# Patient Record
Sex: Male | Born: 1935 | Race: White | Hispanic: No | Marital: Married
Health system: Southern US, Community
[De-identification: ages and names within clinical notes are randomized; demographics above are authoritative.]

---

## 2004-10-18 ENCOUNTER — Ambulatory Visit: Payer: Self-pay | Admitting: Urology

## 2004-12-23 ENCOUNTER — Ambulatory Visit: Payer: Self-pay | Admitting: Urology

## 2004-12-30 ENCOUNTER — Ambulatory Visit: Payer: Self-pay | Admitting: Urology

## 2006-06-16 ENCOUNTER — Ambulatory Visit: Payer: Self-pay | Admitting: Gastroenterology

## 2006-07-27 ENCOUNTER — Emergency Department: Payer: Self-pay | Admitting: Internal Medicine

## 2006-08-03 ENCOUNTER — Emergency Department: Payer: Self-pay | Admitting: Unknown Physician Specialty

## 2006-10-09 ENCOUNTER — Other Ambulatory Visit: Payer: Self-pay

## 2006-10-09 ENCOUNTER — Ambulatory Visit: Payer: Self-pay | Admitting: Urology

## 2006-10-28 ENCOUNTER — Ambulatory Visit: Payer: Self-pay | Admitting: Urology

## 2007-07-17 ENCOUNTER — Ambulatory Visit: Payer: Self-pay | Admitting: Oncology

## 2007-08-13 ENCOUNTER — Inpatient Hospital Stay: Payer: Self-pay | Admitting: Internal Medicine

## 2007-08-13 ENCOUNTER — Other Ambulatory Visit: Payer: Self-pay

## 2007-09-01 ENCOUNTER — Other Ambulatory Visit: Payer: Self-pay

## 2007-09-02 ENCOUNTER — Inpatient Hospital Stay: Payer: Self-pay | Admitting: General Surgery

## 2007-09-16 ENCOUNTER — Ambulatory Visit: Payer: Self-pay | Admitting: Oncology

## 2007-09-29 ENCOUNTER — Inpatient Hospital Stay: Payer: Self-pay | Admitting: Otolaryngology

## 2008-03-27 ENCOUNTER — Other Ambulatory Visit: Payer: Self-pay

## 2008-03-27 ENCOUNTER — Ambulatory Visit: Payer: Self-pay | Admitting: Ophthalmology

## 2008-04-17 ENCOUNTER — Ambulatory Visit: Payer: Self-pay | Admitting: Ophthalmology

## 2008-07-27 IMAGING — CR DG CHEST 1V PORT
1 series · 1 of 1 positions shown · non-contrast
Comparison: none

REASON FOR EXAM: abdominal pain
COMMENTS:

PROCEDURE:     DXR - DXR PORTABLE CHEST SINGLE VIEW  - September 02, 2007 [DATE]
RESULT:     Comparison is made to study 13 August, 2007.
The lungs are well expanded and clear. The heart and pulmonary vascularity
are normal in appearance. There is no pleural effusion.

[view not recorded]
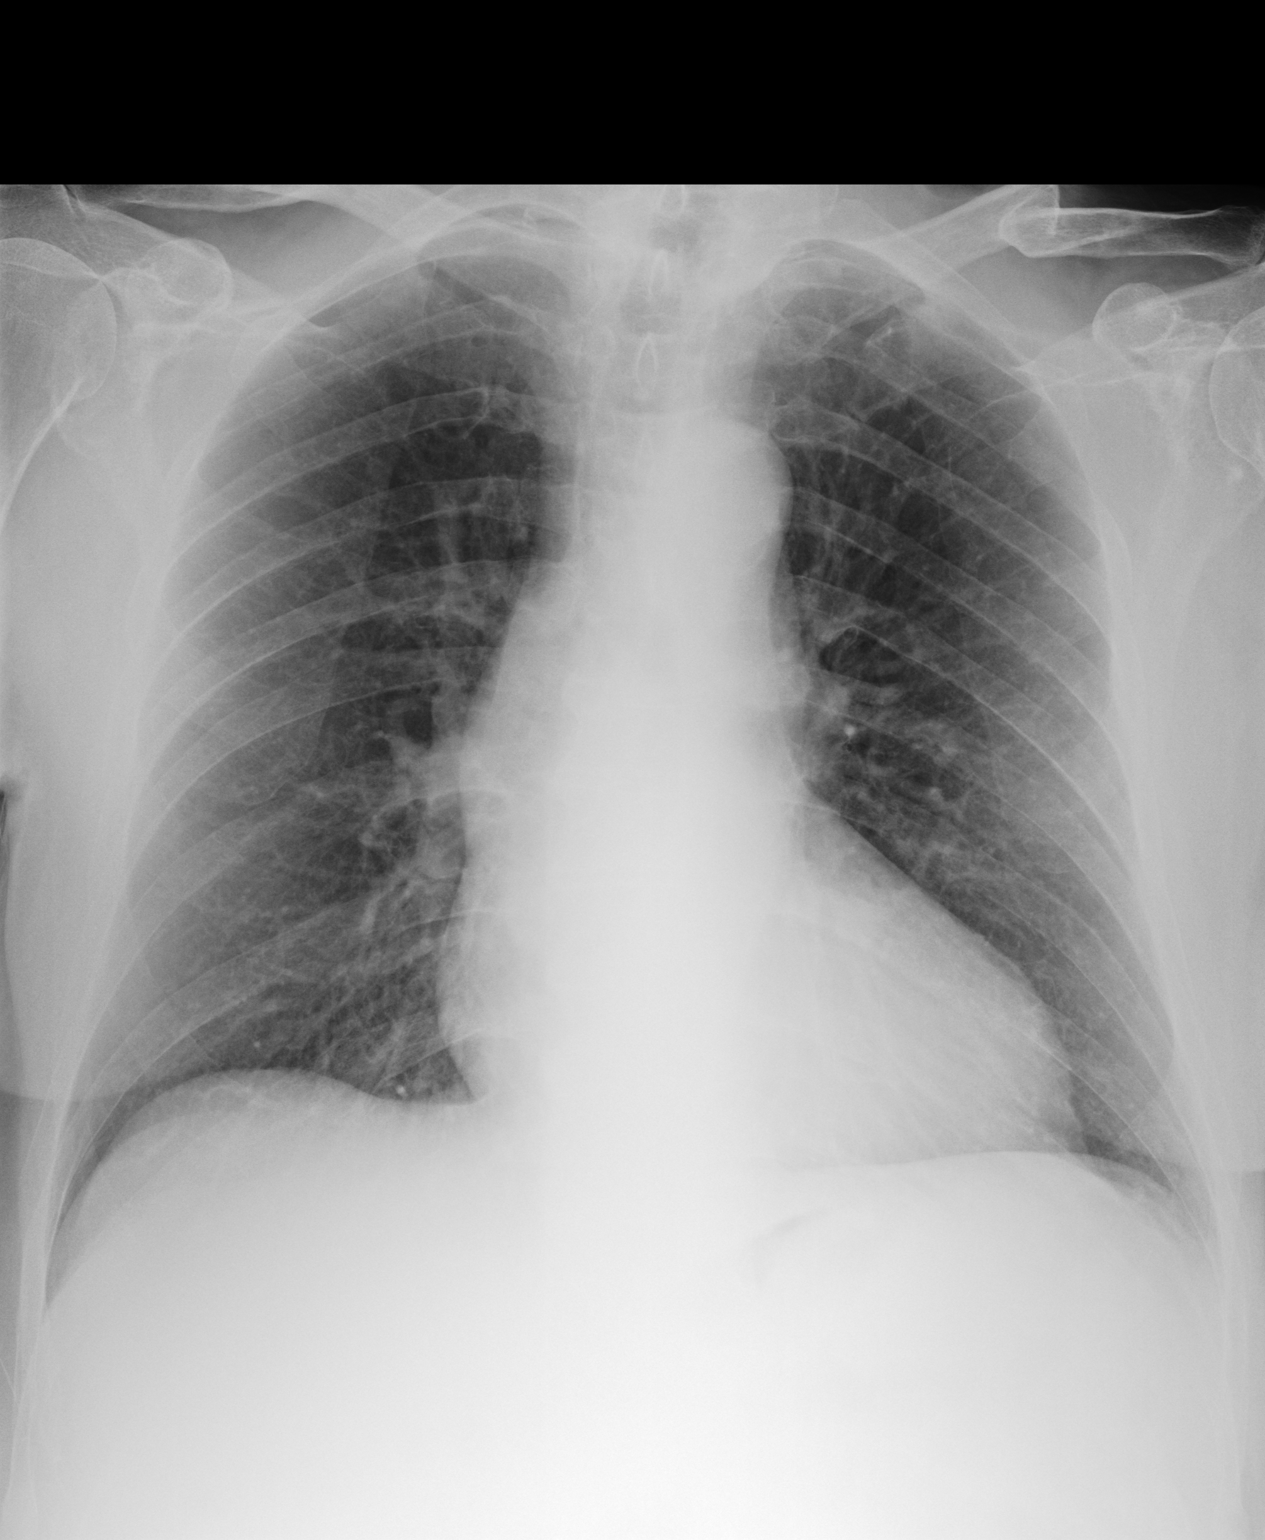

[1 of 1 positions shown; findings below may reference images not displayed]

IMPRESSION: I do not see evidence of acute cardiopulmonary abnormality.

## 2008-07-27 IMAGING — US ABDOMEN ULTRASOUND
1 series · 17 of 25 positions shown · non-contrast
Comparison: none

REASON FOR EXAM: Rule out gallstones
COMMENTS:

[Series 1: abdomen ultrasound · 17 of 80 slices shown]
[im 1/80]
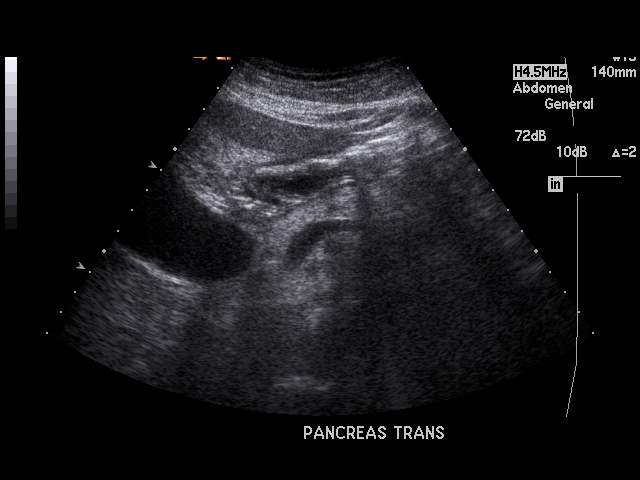
[im 7/80]
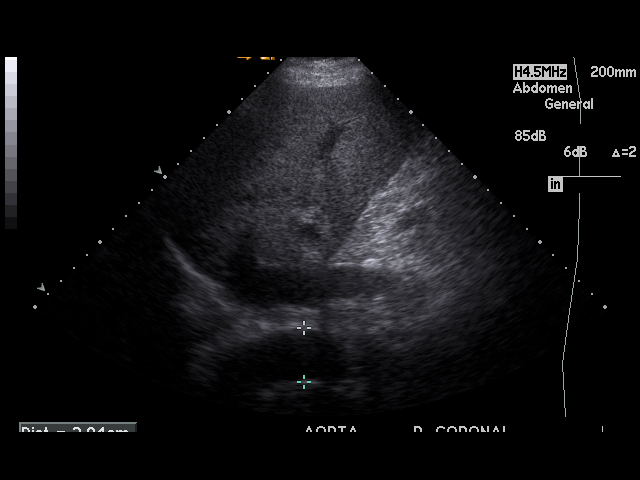
[im 10/80]
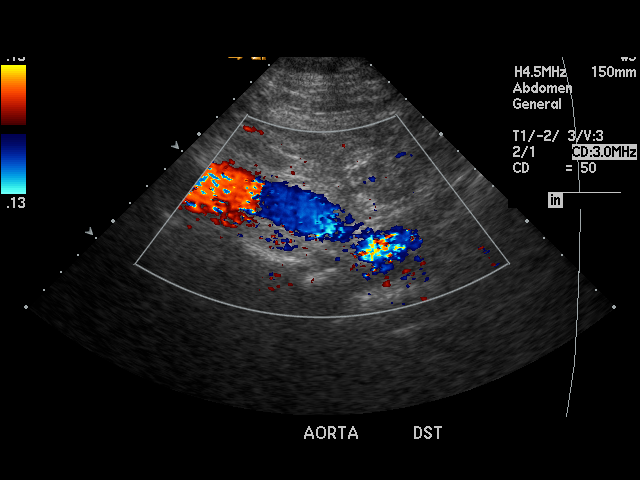
[im 17/80]
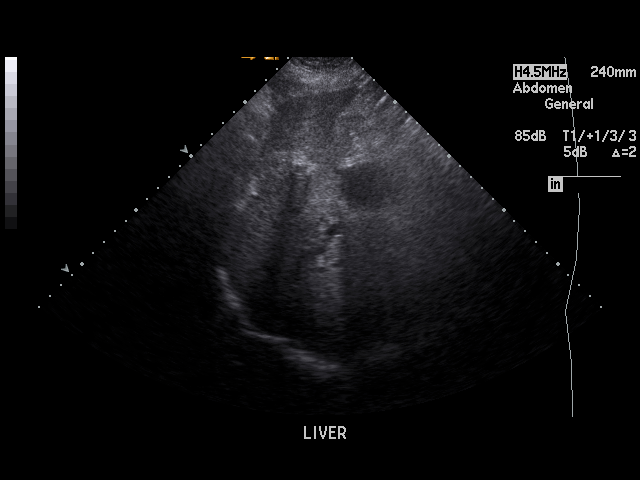
[im 20/80]
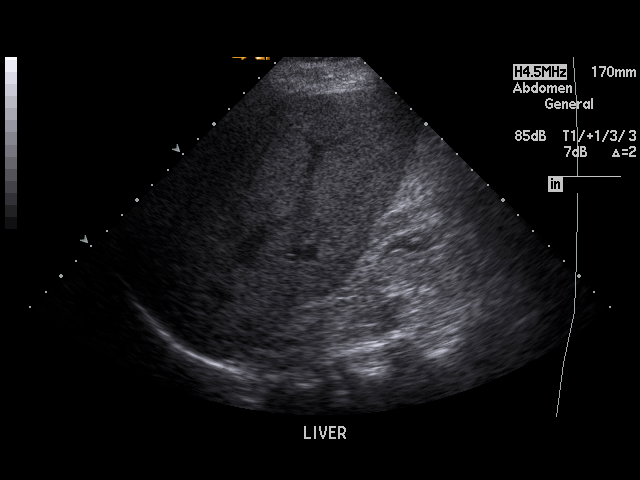
[im 27/80]
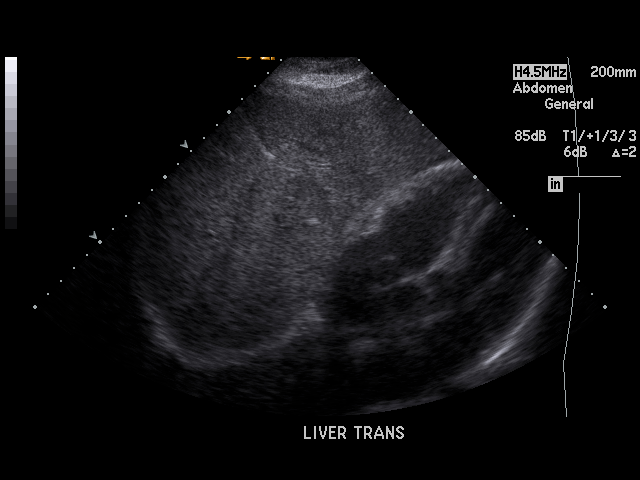
[im 30/80]
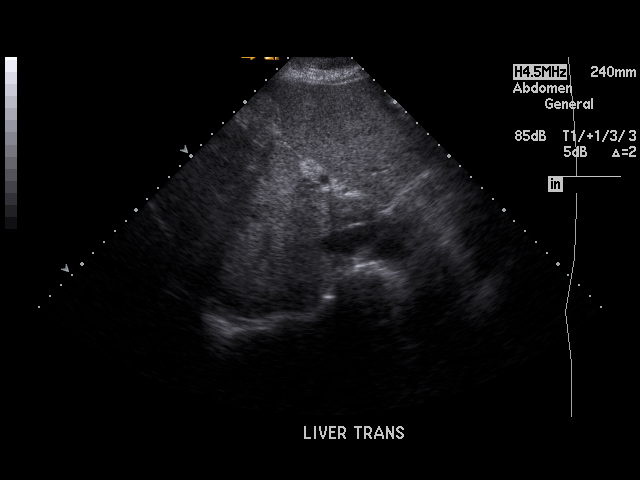
[im 37/80]
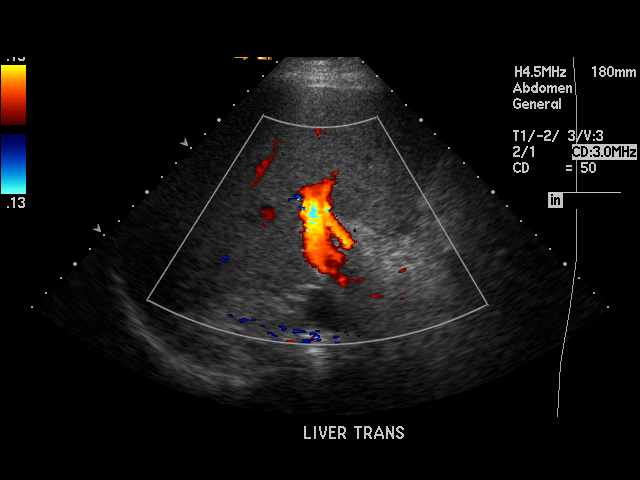
[im 40/80]
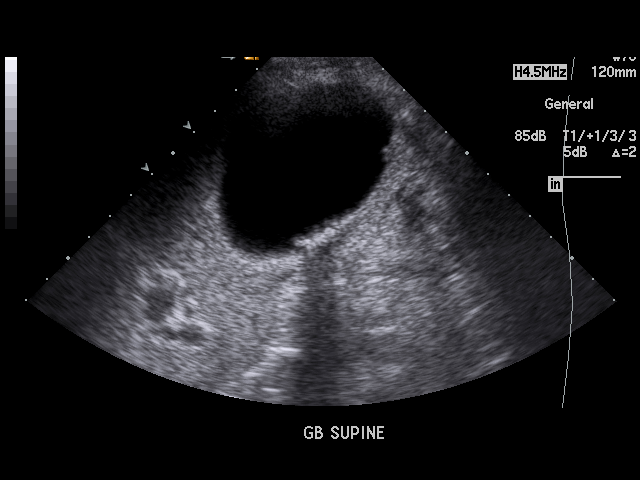
[im 43/80]
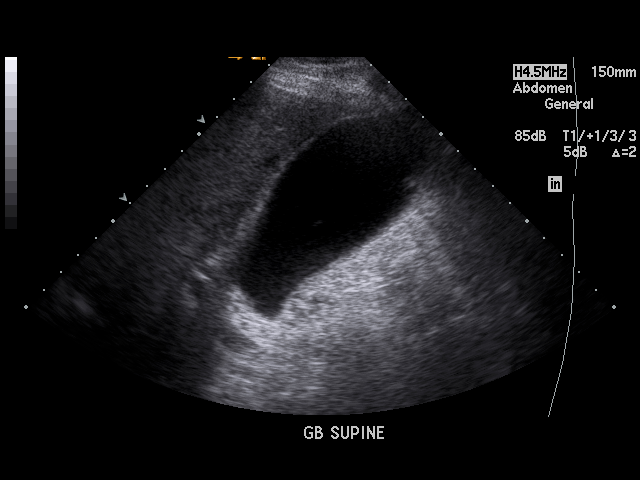
[im 50/80]
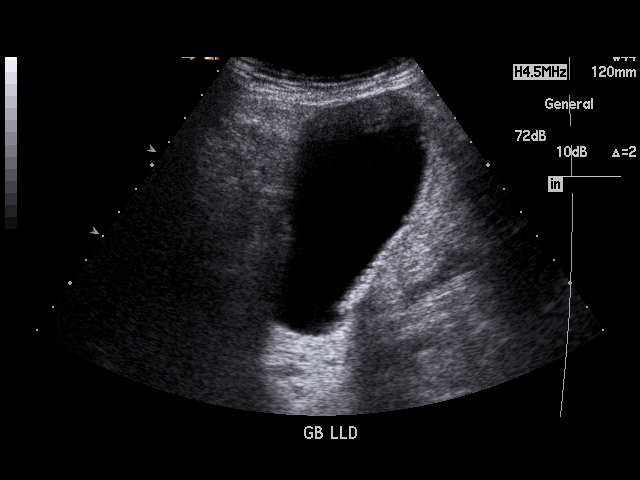
[im 53/80]
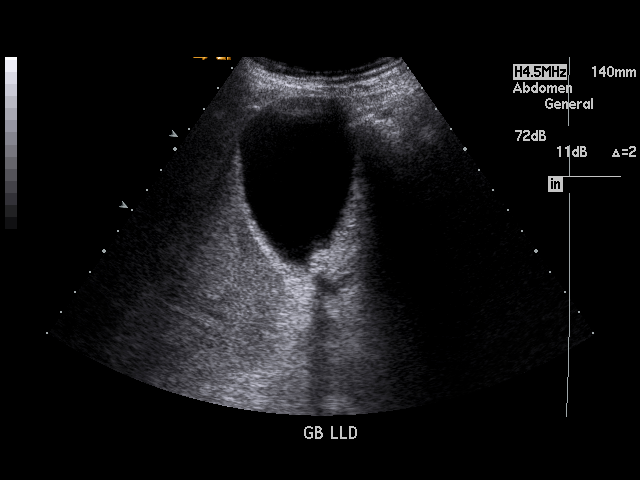
[im 60/80]
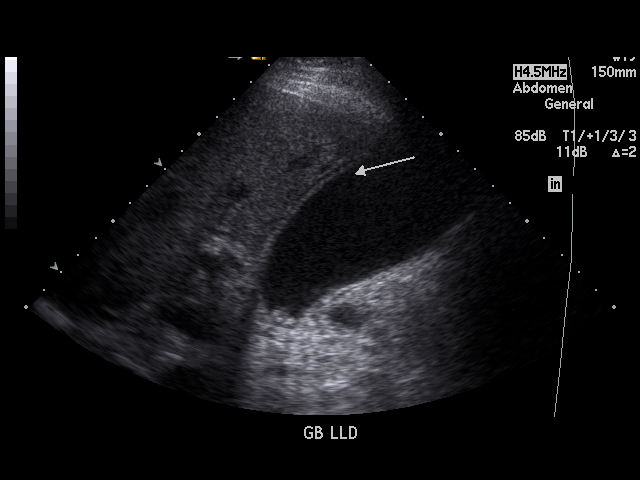
[im 63/80]
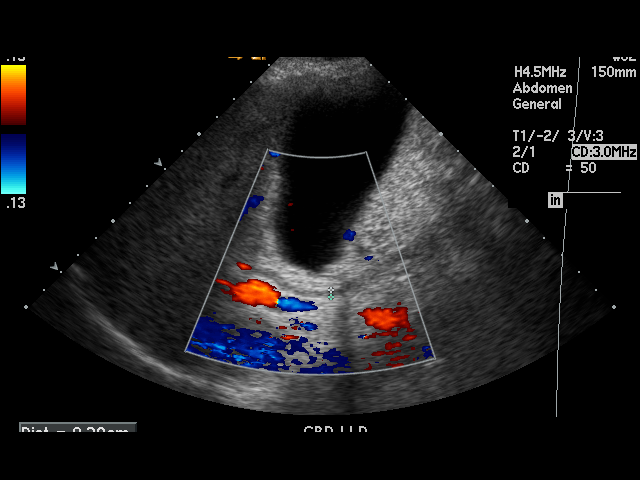
[im 70/80]
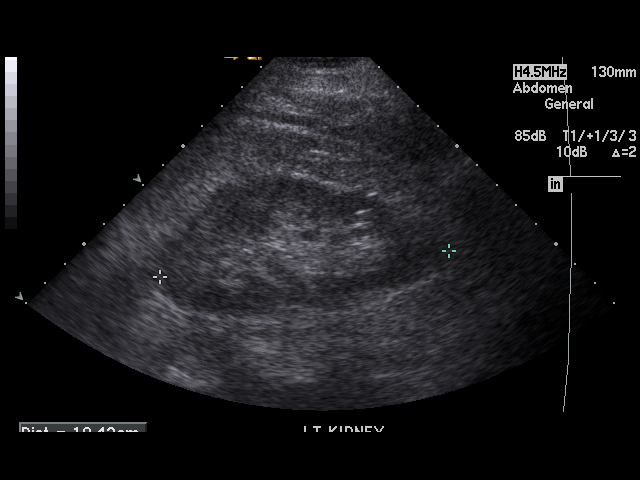
[im 73/80]
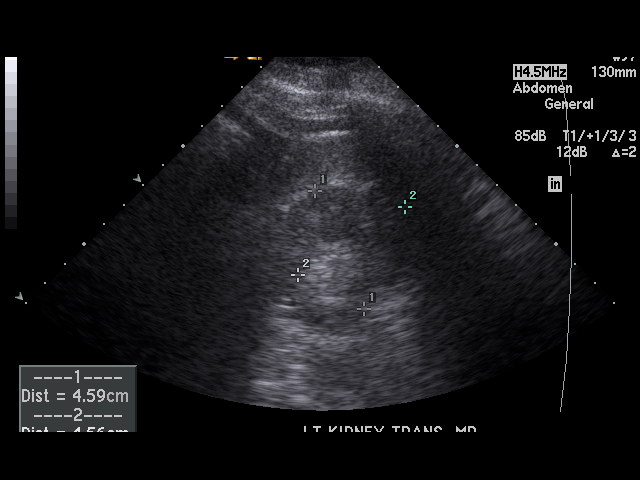
[im 80/80]
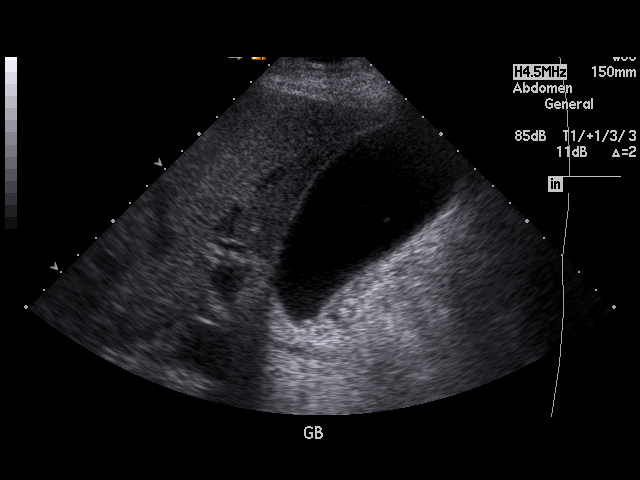

[17 of 25 positions shown; findings below may reference images not displayed]

PROCEDURE:     US  - US ABDOMEN GENERAL SURVEY  - September 02, 2007  [DATE]

RESULT:     The liver, spleen, abdominal aorta and inferior vena cava show
no significant abnormalities. The pancreas is normal in size. There are
multiple, tiny echo densities in the gallbladder compatible stones. One of
the stones or a complex of stones remains in the fundus when the patient
changes position. The possibility of impaction or impending impaction cannot
be excluded. No thickening of the gallbladder wall is currently seen. There
is an apparent trace of pericholecystic fluid. There are noted small cysts
in both kidneys with that on the LEFT measuring 1.87 cm at maximum diameter
and the cyst on the RIGHT measuring 1.76 cm at maximum diameter.
IMPRESSION: 1.  Cholelithiasis with echo densities being observed in the neck of the
gallbladder that do not move as the patient changes position. Impaction or
impending impaction cannot be excluded.
2.  There is no thickening of the gallbladder wall but there does appear to
be a trace of pericholecystic fluid suggestive of gallbladder inflammation.
3.  Bilateral renal cysts are seen.

## 2009-07-17 ENCOUNTER — Ambulatory Visit: Payer: Self-pay | Admitting: Gastroenterology

## 2010-07-29 ENCOUNTER — Encounter: Payer: Self-pay | Admitting: Internal Medicine

## 2010-08-15 ENCOUNTER — Encounter: Payer: Self-pay | Admitting: Internal Medicine

## 2010-09-15 ENCOUNTER — Encounter: Payer: Self-pay | Admitting: Internal Medicine

## 2010-11-12 ENCOUNTER — Ambulatory Visit: Payer: Self-pay | Admitting: General Practice

## 2010-11-27 ENCOUNTER — Inpatient Hospital Stay: Payer: Self-pay | Admitting: General Practice

## 2010-12-03 ENCOUNTER — Encounter: Payer: Self-pay | Admitting: Internal Medicine

## 2010-12-15 ENCOUNTER — Encounter: Payer: Self-pay | Admitting: Internal Medicine

## 2010-12-19 ENCOUNTER — Ambulatory Visit: Payer: Self-pay | Admitting: Internal Medicine

## 2011-01-14 ENCOUNTER — Encounter: Payer: Self-pay | Admitting: Internal Medicine

## 2011-03-28 ENCOUNTER — Inpatient Hospital Stay: Payer: Self-pay | Admitting: Internal Medicine

## 2011-08-02 ENCOUNTER — Emergency Department: Payer: Self-pay | Admitting: Unknown Physician Specialty

## 2012-02-14 ENCOUNTER — Ambulatory Visit: Payer: Self-pay | Admitting: Internal Medicine

## 2012-02-17 LAB — COMPREHENSIVE METABOLIC PANEL
Albumin: 4.1 g/dL (ref 3.4–5.0)
BUN: 19 mg/dL — ABNORMAL HIGH (ref 7–18)
Bilirubin,Total: 0.5 mg/dL (ref 0.2–1.0)
Calcium, Total: 9.5 mg/dL (ref 8.5–10.1)
Chloride: 106 mmol/L (ref 98–107)
Co2: 28 mmol/L (ref 21–32)
Creatinine: 1.36 mg/dL — ABNORMAL HIGH (ref 0.60–1.30)
EGFR (African American): 56 — ABNORMAL LOW
Osmolality: 288 (ref 275–301)
Potassium: 4.2 mmol/L (ref 3.5–5.1)
SGOT(AST): 44 U/L — ABNORMAL HIGH (ref 15–37)
SGPT (ALT): 43 U/L
Sodium: 142 mmol/L (ref 136–145)
Total Protein: 7.9 g/dL (ref 6.4–8.2)

## 2012-02-18 ENCOUNTER — Inpatient Hospital Stay: Payer: Self-pay | Admitting: Internal Medicine

## 2012-02-18 LAB — CBC WITH DIFFERENTIAL/PLATELET
Basophil %: 0.2 %
Eosinophil #: 0.1 10*3/uL (ref 0.0–0.7)
HGB: 13.2 g/dL (ref 13.0–18.0)
Lymphocyte %: 5.6 %
MCH: 31.2 pg (ref 26.0–34.0)
MCHC: 32.3 g/dL (ref 32.0–36.0)
MCV: 97 fL (ref 80–100)
Platelet: 276 10*3/uL (ref 150–440)
RBC: 4.23 10*6/uL — ABNORMAL LOW (ref 4.40–5.90)
RDW: 13.8 % (ref 11.5–14.5)

## 2012-02-18 LAB — URINALYSIS, COMPLETE
Bilirubin,UR: NEGATIVE
Blood: NEGATIVE
Glucose,UR: NEGATIVE mg/dL (ref 0–75)
Nitrite: NEGATIVE
Ph: 5 (ref 4.5–8.0)
Protein: 100
RBC,UR: 1 /HPF (ref 0–5)
Specific Gravity: 1.03 (ref 1.003–1.030)
Squamous Epithelial: 1

## 2012-02-19 LAB — CBC WITH DIFFERENTIAL/PLATELET
Basophil #: 0.1 10*3/uL (ref 0.0–0.1)
Basophil %: 0.8 %
Eosinophil #: 0.7 10*3/uL (ref 0.0–0.7)
Eosinophil %: 6.4 %
HCT: 33.7 % — ABNORMAL LOW (ref 40.0–52.0)
HGB: 11.1 g/dL — ABNORMAL LOW (ref 13.0–18.0)
Lymphocyte #: 1.2 10*3/uL (ref 1.0–3.6)
MCH: 31.9 pg (ref 26.0–34.0)
MCHC: 33 g/dL (ref 32.0–36.0)
MCV: 97 fL (ref 80–100)
Platelet: 180 10*3/uL (ref 150–440)
WBC: 10.2 10*3/uL (ref 3.8–10.6)

## 2012-02-19 LAB — BASIC METABOLIC PANEL
Anion Gap: 9 (ref 7–16)
Calcium, Total: 8.7 mg/dL (ref 8.5–10.1)
Chloride: 107 mmol/L (ref 98–107)
Co2: 26 mmol/L (ref 21–32)
Creatinine: 1.38 mg/dL — ABNORMAL HIGH (ref 0.60–1.30)
EGFR (Non-African Amer.): 48 — ABNORMAL LOW
Potassium: 3.9 mmol/L (ref 3.5–5.1)
Sodium: 142 mmol/L (ref 136–145)

## 2012-02-20 LAB — BASIC METABOLIC PANEL
BUN: 14 mg/dL (ref 7–18)
Calcium, Total: 8.3 mg/dL — ABNORMAL LOW (ref 8.5–10.1)
Co2: 26 mmol/L (ref 21–32)
Creatinine: 1.2 mg/dL (ref 0.60–1.30)
Glucose: 97 mg/dL (ref 65–99)
Potassium: 3.6 mmol/L (ref 3.5–5.1)

## 2012-02-20 LAB — CBC WITH DIFFERENTIAL/PLATELET
Basophil #: 0.1 10*3/uL (ref 0.0–0.1)
Basophil %: 0.6 %
HGB: 10.8 g/dL — ABNORMAL LOW (ref 13.0–18.0)
Lymphocyte #: 1.1 10*3/uL (ref 1.0–3.6)
MCH: 32.9 pg (ref 26.0–34.0)
MCV: 96 fL (ref 80–100)
Monocyte %: 6.5 %
Neutrophil #: 7.9 10*3/uL — ABNORMAL HIGH (ref 1.4–6.5)
Neutrophil %: 74 %
Platelet: 159 10*3/uL (ref 150–440)
RDW: 13 % (ref 11.5–14.5)
WBC: 10.6 10*3/uL (ref 3.8–10.6)

## 2012-02-21 LAB — HEMOGLOBIN: HGB: 10.9 g/dL — ABNORMAL LOW (ref 13.0–18.0)

## 2012-02-22 LAB — BASIC METABOLIC PANEL
BUN: 17 mg/dL (ref 7–18)
Calcium, Total: 8.6 mg/dL (ref 8.5–10.1)
EGFR (African American): >60
EGFR (Non-African Amer.): >60
Osmolality: 285 (ref 275–301)

## 2012-02-23 LAB — BASIC METABOLIC PANEL
Anion Gap: 8 (ref 7–16)
BUN: 13 mg/dL (ref 7–18)
Calcium, Total: 8.9 mg/dL (ref 8.5–10.1)
Co2: 28 mmol/L (ref 21–32)
Creatinine: 0.84 mg/dL (ref 0.60–1.30)
EGFR (African American): >60
EGFR (Non-African Amer.): >60
Osmolality: 283 (ref 275–301)
Potassium: 3.8 mmol/L (ref 3.5–5.1)
Sodium: 142 mmol/L (ref 136–145)

## 2012-02-23 LAB — HEMOGLOBIN: HGB: 10.6 g/dL — ABNORMAL LOW (ref 13.0–18.0)

## 2012-03-15 ENCOUNTER — Ambulatory Visit: Payer: Self-pay | Admitting: Internal Medicine

## 2012-10-04 ENCOUNTER — Emergency Department: Payer: Self-pay | Admitting: Emergency Medicine

## 2012-12-14 ENCOUNTER — Emergency Department: Payer: Self-pay | Admitting: Emergency Medicine

## 2012-12-14 LAB — URINALYSIS, COMPLETE
Bacteria: NONE SEEN
Bilirubin,UR: NEGATIVE
Blood: NEGATIVE
Glucose,UR: NEGATIVE mg/dL (ref 0–75)
Hyaline Cast: 4
Leukocyte Esterase: NEGATIVE
Nitrite: NEGATIVE
Ph: 6 (ref 4.5–8.0)
RBC,UR: <1 /HPF (ref 0–5)
Squamous Epithelial: <1

## 2012-12-14 LAB — COMPREHENSIVE METABOLIC PANEL
Alkaline Phosphatase: 67 U/L (ref 50–136)
Anion Gap: 4 — ABNORMAL LOW (ref 7–16)
BUN: 24 mg/dL — ABNORMAL HIGH (ref 7–18)
Chloride: 104 mmol/L (ref 98–107)
Co2: 33 mmol/L — ABNORMAL HIGH (ref 21–32)
Creatinine: 1.5 mg/dL — ABNORMAL HIGH (ref 0.60–1.30)
EGFR (African American): 50 — ABNORMAL LOW
EGFR (Non-African Amer.): 43 — ABNORMAL LOW
Glucose: 95 mg/dL (ref 65–99)
Osmolality: 285 (ref 275–301)
Potassium: 3.8 mmol/L (ref 3.5–5.1)
SGOT(AST): 17 U/L (ref 15–37)
SGPT (ALT): 10 U/L — ABNORMAL LOW (ref 12–78)
Total Protein: 6.5 g/dL (ref 6.4–8.2)

## 2012-12-14 LAB — CBC
HCT: 38.5 % — ABNORMAL LOW (ref 40.0–52.0)
MCH: 31 pg (ref 26.0–34.0)
MCV: 94 fL (ref 80–100)
Platelet: 366 10*3/uL (ref 150–440)
RDW: 13.8 % (ref 11.5–14.5)
WBC: 10.8 10*3/uL — ABNORMAL HIGH (ref 3.8–10.6)

## 2013-01-19 ENCOUNTER — Emergency Department: Payer: Self-pay | Admitting: Emergency Medicine

## 2013-01-21 ENCOUNTER — Emergency Department: Payer: Self-pay | Admitting: Emergency Medicine

## 2013-01-21 LAB — COMPREHENSIVE METABOLIC PANEL
Alkaline Phosphatase: 70 U/L (ref 50–136)
Bilirubin,Total: 0.6 mg/dL (ref 0.2–1.0)
Chloride: 106 mmol/L (ref 98–107)
Co2: 32 mmol/L (ref 21–32)
EGFR (African American): 58 — ABNORMAL LOW
EGFR (Non-African Amer.): 50 — ABNORMAL LOW
Osmolality: 280 (ref 275–301)
Potassium: 4 mmol/L (ref 3.5–5.1)
SGPT (ALT): 15 U/L (ref 12–78)
Sodium: 140 mmol/L (ref 136–145)

## 2013-01-21 LAB — CK TOTAL AND CKMB (NOT AT ARMC)
CK, Total: 37 U/L (ref 35–232)
CK-MB: <0.5 ng/mL — ABNORMAL LOW (ref 0.5–3.6)

## 2013-01-21 LAB — CBC
HCT: 39.6 % — ABNORMAL LOW (ref 40.0–52.0)
MCHC: 33.7 g/dL (ref 32.0–36.0)
Platelet: 255 10*3/uL (ref 150–440)

## 2013-01-21 LAB — URINALYSIS, COMPLETE
Blood: NEGATIVE
Glucose,UR: NEGATIVE mg/dL (ref 0–75)
Leukocyte Esterase: NEGATIVE
Protein: 30
Specific Gravity: 1.023 (ref 1.003–1.030)
Squamous Epithelial: 1
WBC UR: 2 /HPF (ref 0–5)

## 2013-01-21 LAB — TROPONIN I: Troponin-I: <0.02 ng/mL

## 2013-07-04 ENCOUNTER — Emergency Department: Payer: Self-pay | Admitting: Emergency Medicine

## 2013-07-04 LAB — URINALYSIS, COMPLETE
Bilirubin,UR: NEGATIVE
Glucose,UR: NEGATIVE mg/dL (ref 0–75)
Nitrite: NEGATIVE
Protein: 30
RBC,UR: 26 /HPF (ref 0–5)
Specific Gravity: 1.016 (ref 1.003–1.030)
Squamous Epithelial: 1

## 2013-07-04 LAB — BASIC METABOLIC PANEL
Anion Gap: 4 — ABNORMAL LOW (ref 7–16)
Calcium, Total: 9 mg/dL (ref 8.5–10.1)
Chloride: 108 mmol/L — ABNORMAL HIGH (ref 98–107)
Creatinine: 1.39 mg/dL — ABNORMAL HIGH (ref 0.60–1.30)
EGFR (African American): 54 — ABNORMAL LOW
Glucose: 109 mg/dL — ABNORMAL HIGH (ref 65–99)
Osmolality: 283 (ref 275–301)
Potassium: 3.7 mmol/L (ref 3.5–5.1)
Sodium: 141 mmol/L (ref 136–145)

## 2013-07-04 LAB — CBC
HCT: 39.8 % — ABNORMAL LOW (ref 40.0–52.0)
MCH: 32.5 pg (ref 26.0–34.0)
RDW: 13.4 % (ref 11.5–14.5)

## 2013-07-06 LAB — URINE CULTURE

## 2013-09-15 DEATH — deceased

## 2014-05-29 IMAGING — CT CT HEAD WITHOUT CONTRAST
1 series · 15 of 30 positions shown, 19 images · non-contrast
Comparison: none

REASON FOR EXAM: syncope
COMMENTS:

[Series 2: soft tissue · axial · 0.42mm/px · z∈[+305,+445]mm · 15 of 32 slices shown, 19 images]
[im 2/32  brain]
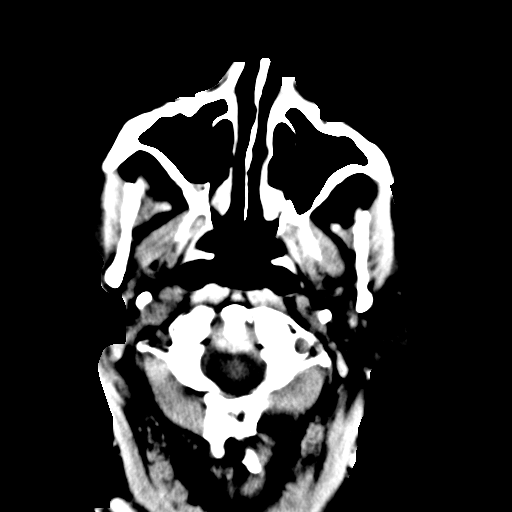
[im 2/32  bone]
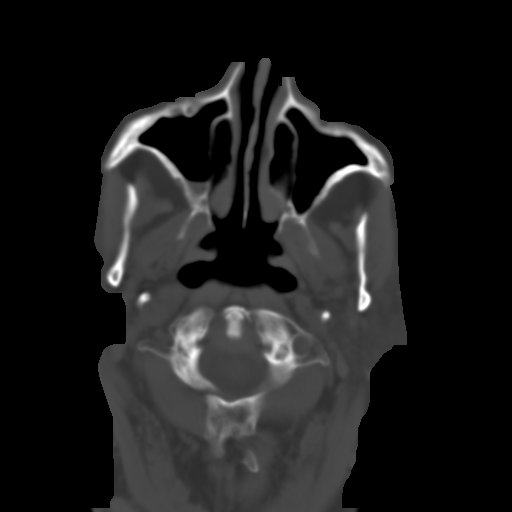
[im 4/32  brain]
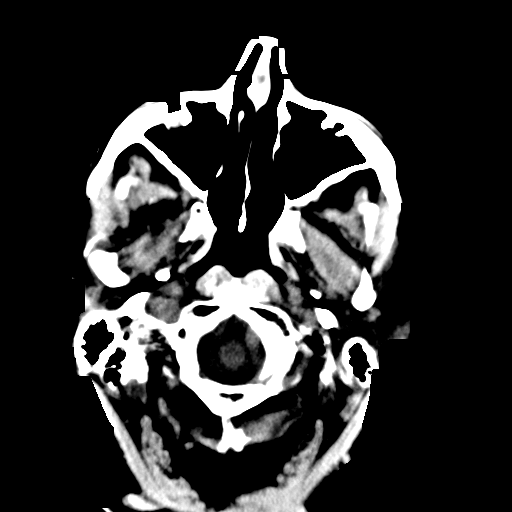
[im 6/32  brain]
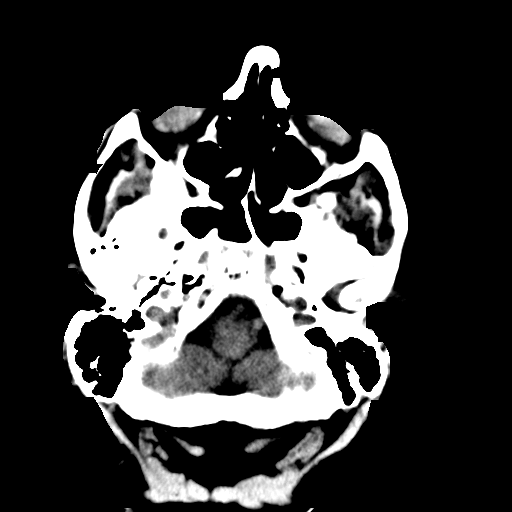
[im 8/32  brain]
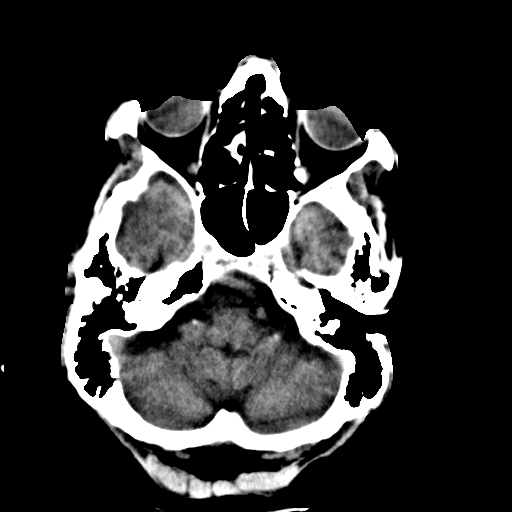
[im 10/32  brain]
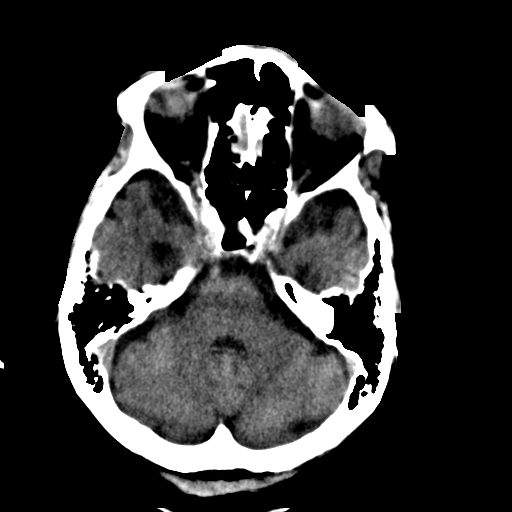
[im 10/32  bone]
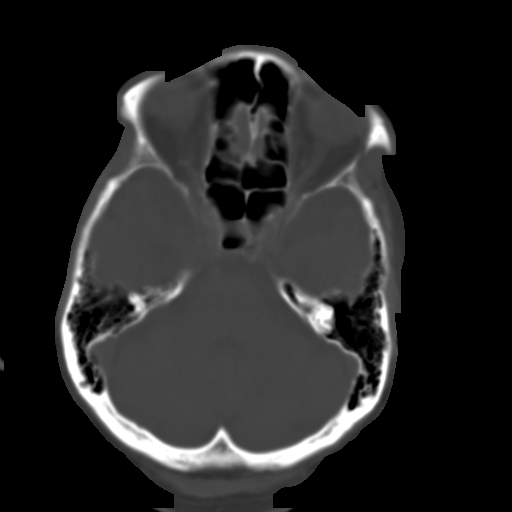
[im 12/32  brain]
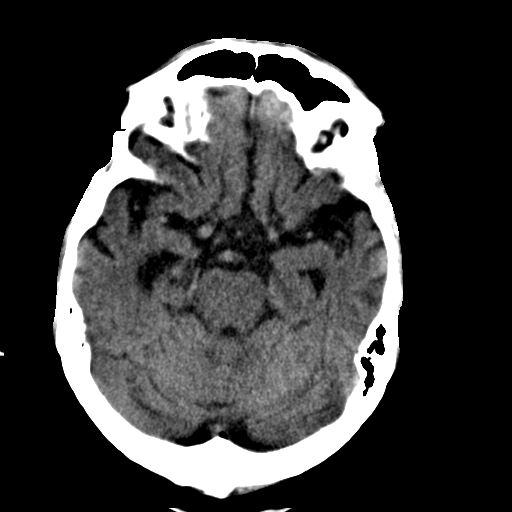
[im 14/32  brain]
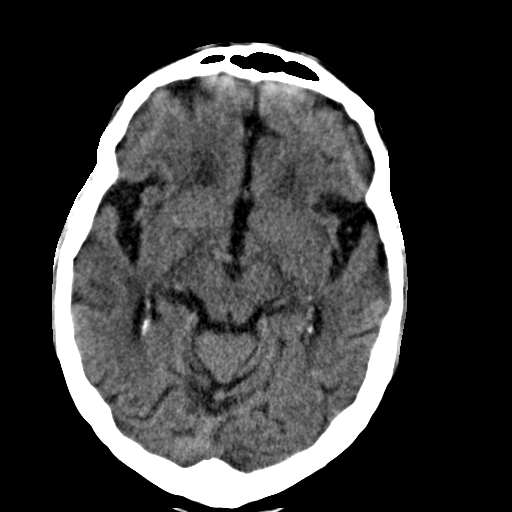
[im 17/32  brain]
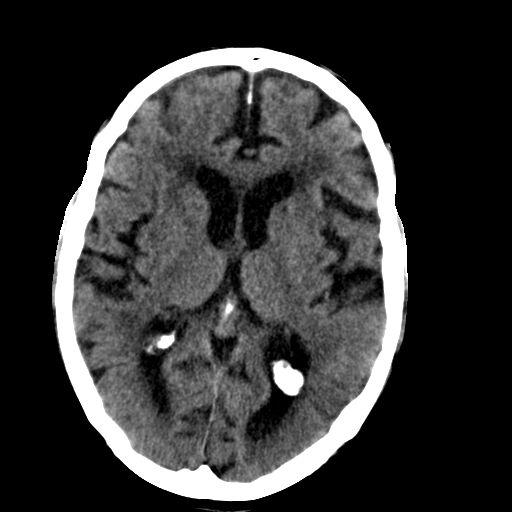
[im 18/32  brain]
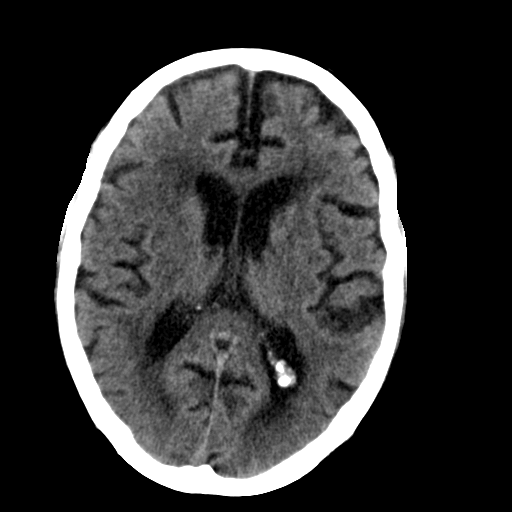
[im 18/32  bone]
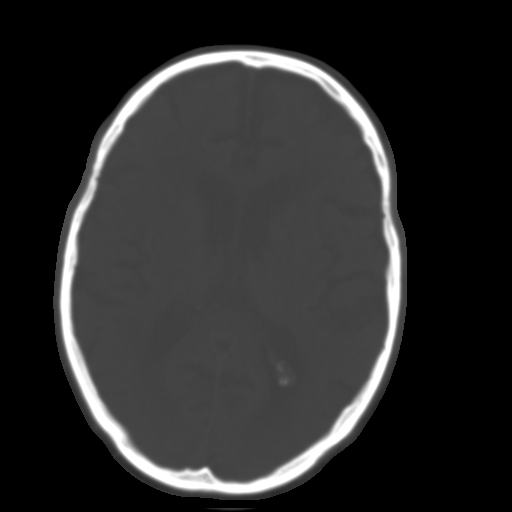
[im 20/32  brain]
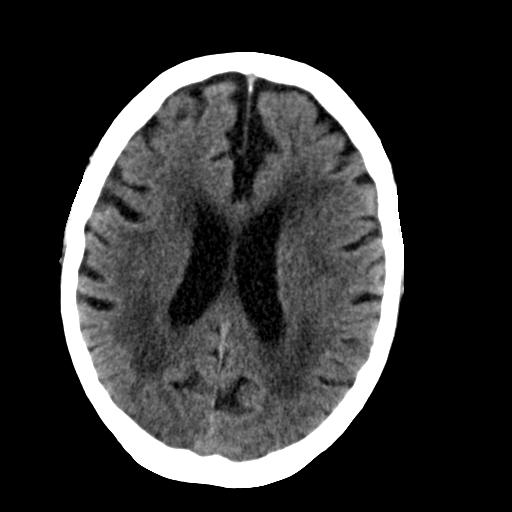
[im 22/32  brain]
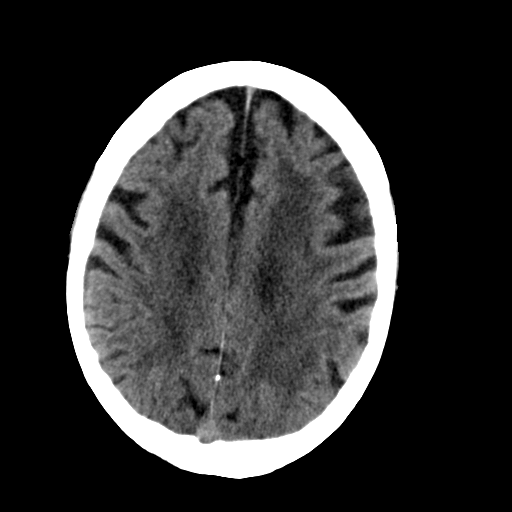
[im 24/32  brain]
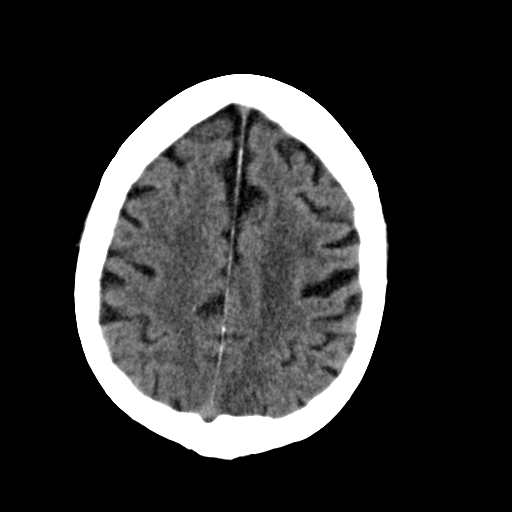
[im 26/32  brain]
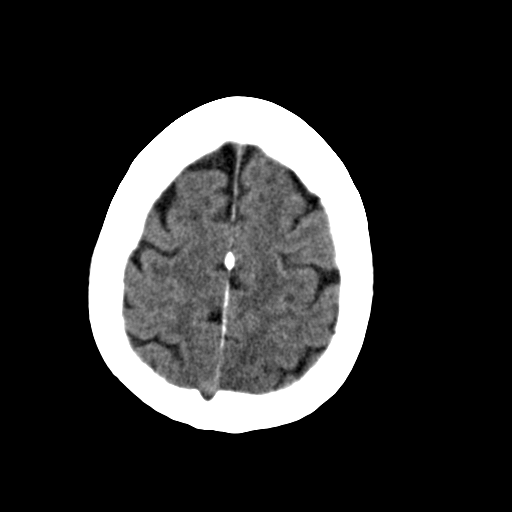
[im 26/32  bone]
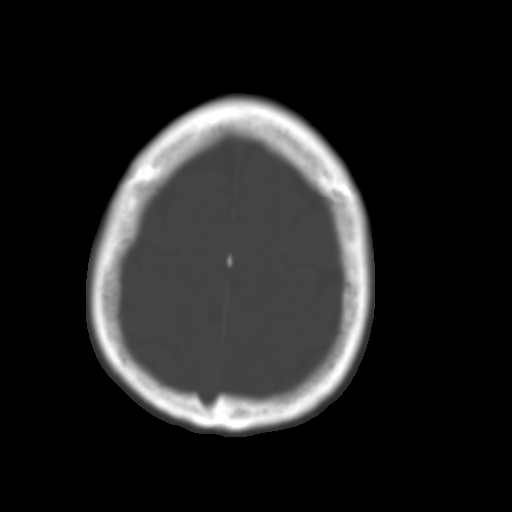
[im 28/32  brain]
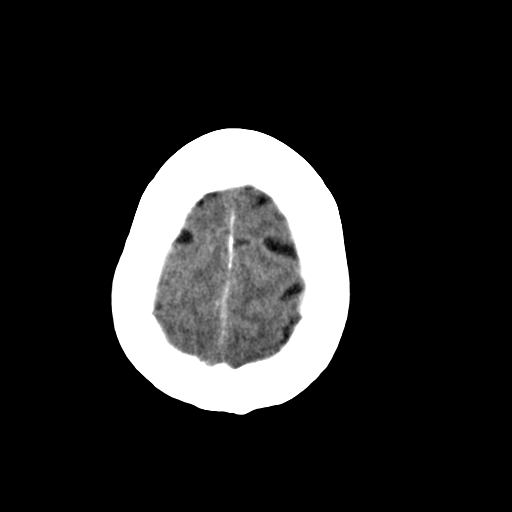
[im 30/32  brain]
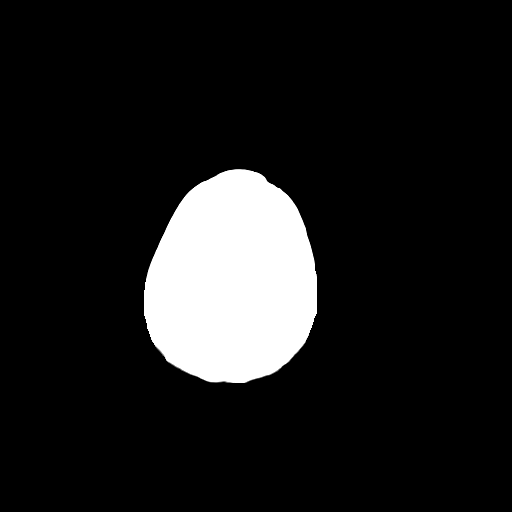

[15 of 30 positions shown; findings below may reference images not displayed]

PROCEDURE:     CT  - CT HEAD WITHOUT CONTRAST  - July 04, 2013  [DATE]

RESULT:     Axial noncontrast CT scanning was performed through the brain
with reconstructions at 5 mm intervals and slice thicknesses. Comparison is
made to study January 21, 2013.

There is moderate diffuse cerebral and cerebellar atrophy consistent with
the patient's age. There is mild compensatory ventriculomegaly. There is
decreased density in the deep white matter both cerebral hemispheres
consistent with chronic small vessel ischemia. There is no evidence of an
acute intracranial hemorrhage nor of an evolving ischemic infarction.

There is a small air-fluid level in the posterior aspect of the right
maxillary sinus similar to that seen in January 2013. There is no evidence of an
acute skull fracture.
IMPRESSION: 1. There is no evidence of an acute intracranial hemorrhage nor of an
evolving ischemic infarction.
2. There is no intracranial mass effect nor hydrocephalus.
3. There is decreased density in the deep white matter of both cerebral
hemispheres consistent with chronic small vessel ischemic type change.

[REDACTED]

## 2015-01-07 NOTE — Consult Note (Signed)
Comments   Dr Phifer and I met with pt's daughter, Joycelyn Schmid, who is also pt's HCPOA. Daughter updated on pt's status. Pt has been living at home with gradual decline since his wife died in 05-22-23. Pt has 24/7 caregivers in the home including a sitter that stays with patient during the day, a granddaughter at night, and daughter during the weekends. Family would prefer if patient were able to return home at discharge but would be willing to accept STR as long as pt eventually returned home. Family understand that patient may not do well and want to give it the weekend before making further decisions regarding care. If it appears patient is approaching end of life, daughter would want to take patient home with hospice. verbalized preference for DNR. Will change code status to reflect family request.  30 minutes   Electronic Signatures for Addendum Section:  Phifer, Izora Gala (MD) (Signed Addendum 07-Jun-13 16:04)  Billey Chang, NP, and I met with pt's daughter. Agree with assessment and plan as outlined in above note.   Electronic Signatures: Borders, Kirt Boys (NP)  (Signed 07-Jun-13 15:56)  Authored: Palliative Care   Last Updated: 07-Jun-13 16:04 by Phifer, Izora Gala (MD)

## 2015-01-07 NOTE — H&P (Signed)
PATIENT NAME:  Mitchell Reynolds, BARTELL MR#:  [id number] DATE OF BIRTH:  1928/06/19  DATE OF ADMISSION:  02/18/2012  PRIMARY CARE PHYSICIAN: Conchita Parison Chaplin, MD   CHIEF COMPLAINT: Bilateral hip pain.   HISTORY OF PRESENT ILLNESS: Mitchell Reynolds is an 81 year old pleasant Caucasian male who was in his usual state of health at home and while walking to the bathroom he fell sustaining pain in the pelvic area and bilateral hips. The patient was unable to stand up. His caregiver called his daughter and then they called the ambulance and the patient was transported here. Evaluation here revealed a pubic ramus fracture. The patient was also seen by orthopedic surgery and their conclusion is that there is not a surgical approach. The patient is now being admitted for further evaluation and to involve physical therapy and for pain control. The patient himself does not recall why he fell, how it happened, if slipped or he tripped. He said that his memory is not good and he is forgetful.   REVIEW OF SYSTEMS: CONSTITUTIONAL: Denies any fever. No chills. No fatigue. EYES: No blurring of vision. No double vision. ENT: No hearing impairment. No sore throat. No dysphagia. CARDIOVASCULAR: No chest pain. No shortness of breath. No cough. No syncope. RESPIRATORY: No cough. No chest pain. No hemoptysis. No shortness of breath. GASTROINTESTINAL: No abdominal pain. No vomiting. No diarrhea. GENITOURINARY: No dysuria. No frequency of urination. MUSCULOSKELETAL: He reports chronic bilateral hip pain. This is ongoing for a while and he is seeing his orthopedic surgeon for that. No muscular pain. No muscular tenderness or swelling. No joint swelling. INTEGUMENTARY: No skin rash. No ulcers. NEUROLOGY: No focal weakness. No seizure activity. No headache. PSYCHIATRY: No anxiety. No depression. ENDOCRINE: No polyuria or polydipsia. No heat or cold intolerance.   PAST MEDICAL HISTORY:  1. Mild dementia; he is slightly forgetful.  2. In  July of last year he was admitted with upper gastrointestinal bleeding secondary to gastric ulcers.  3. Degenerative arthritis. 4. Systemic hypertension. 5. Prior history of bladder cancer, in remission. 6. Diverticulosis. 7. Glaucoma.   PAST SURGICAL HISTORY:  1. Bilateral total hip replacement. 2. Hernia repair.  3. Cholecystectomy.   SOCIAL HISTORY: He is widowed and lives at home. He is cared for by a caregiver. 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   SOCIAL HABITS: Nonsmoker, but occasionally he may smoke a cigar. No history of alcohol or drug abuse.   FAMILY HISTORY: The patient does not recall much details.   ADMISSION MEDICATIONS:  1. Protonix 40 mg a day. 2. Dulcolax 1 capsule twice a day.  3. Multivitamin once a day.  4. Probiotics once a day. 5. Cranberry tablets.   ALLERGIES: No known drug allergies.   PHYSICAL EXAMINATION:   VITAL SIGNS: Blood pressure 109/67, respiratory rate 16, pulse 90, temperature 95.9, and oxygen saturation 88%.   GENERAL APPEARANCE: Elderly male lying in bed in no acute distress.   HEAD AND NECK: No pallor. No icterus. No cyanosis.   EARS, NOSE, AND THROAT: Hearing was normal. Nasal mucosa, lips, and tongue were normal.   EYES: Normal iris and conjunctivae. Pupils are about 6 mm, equal and sluggishly reactive to light.   NECK: Supple. Trachea at midline. No thyromegaly. No cervical lymphadenopathy. No masses.   HEART: Normal S1 and S2. No S3 or S4. No murmur. No gallop. No carotid bruits.   RESPIRATORY: Normal breathing pattern without accessory muscles. No rales. No wheezing.  ABDOMEN: Soft without tenderness. No hepatosplenomegaly. No masses. No hernias.   SKIN: No ulcers. No subcutaneous nodules.   MUSCULOSKELETAL: No joint swelling. No clubbing. His hip movements are limited due to pain.   NEUROLOGIC: Cranial nerves II through XII are intact. No focal motor deficits.    PSYCHIATRIC: The patient is oriented to people. He recognized his daughter. He knows that this is a hospital, but could not recall the name. He does not know the date or the day or the year nor the name of the President of the [place]. Mood and affect were normal.   LABS/STUDIES: Serum glucose 137, BUN 19, creatinine 1.3, sodium 142, potassium 4.2, and calcium 9.5. Normal liver function tests. AST was 44 and ALT 43. CBC showed elevated white count at 18,000. Hemoglobin 13, hematocrit 40, and platelet count 276.   Urinalysis is pending.   CAT scan of the head showed no acute intracranial abnormality.   X-ray of the pelvis and right and left hip reported pelvic fracture or pubic ramus fracture.   ASSESSMENT:  1. Pelvic fracture status post fall.  2. Leukocytosis with white count reaching 18,000. A source is not identified. Urinalysis is pending. This could be also reactive to the fracture.  3. Dementia.  4. History of gastric ulcers.  5. Degenerative osteoarthritis status post bilateral hip replacement.  6. History of bladder cancer, in remission.   PLAN: The patient is admitted to the medical floor in consultation with the orthopedic team whose opinion is that this is not a surgical case. Consult physical therapy. Evidently the patient is going to need to be arranged to have rehabilitation. Pain control. Follow up on the urinalysis to ensure there is no underlying urinary tract infection. Continue Protonix 40 mg a day. The patient does not have a Living Will, but he had appointed his daughter, Wynonia Hazard, to have the power of attorney to make medical decisions.   TIME SPENT: Time Spent evaluating this patient and reviewing medical records and discussion with the daughter and the patient took more than 55 minutes.  ____________________________ Carney Corners. Rudene Re, MD amd:slb D: 02/18/2012 01:11:24 ET T: 02/18/2012 07:23:43 ET JOB#: [id number]  cc: Carney Corners. Rudene Re, MD, <Dictator> Jimmie Molly. Candelaria Stagers, MD Zollie Scale MD ELECTRONICALLY SIGNED 02/19/2012 6:14

## 2015-01-07 NOTE — Discharge Summary (Signed)
PATIENT NAME:  Mitchell Reynolds, BRACKNELL MR#:  [id number] DATE OF BIRTH:  May 19, 1928  DATE OF ADMISSION:  02/18/2012 DATE OF DISCHARGE:  02/23/2012  HISTORY OF PRESENT ILLNESS: [name] is an 81 year old man who lives at home who has become quite frail secondary to progressive age-related dementia. He fell while walking to the bathroom sustaining a fracture to the pelvic area. He was brought to the Emergency Room, was seen in consultation by orthopedics who felt no surgical intervention would be appropriate. Patient had some element of delirium associated with his dementia early on, this improved. His nutrition support remained poor due to his previous long-standing effort being a poor feeder with poor appetite.  PAST MEDICAL HISTORY:  1. History of bladder cancer in remission. 2. Diverticulosis. 3. Previous GI bleeding.  4. Glaucoma. 5. Hypertension. 6. Degenerative arthritis.  7. In July 2012 he was admitted with significant gastrointestinal bleeding. At that time he had a gastric ulcer. This has been in remission.   LABORATORY, DIAGNOSTIC AND RADIOLOGICAL DATA: At the time of admission sugar 135, BUN 19, creatinine 1.36, sodium 142, potassium 4.2, EGFR 48, white count 18,200, hemoglobin 13.2. He did receive some IV fluids early on. His EGFR maxed out at greater than 60 on 06/09. Sugar 100, BUN 17, creatinine 1.1, sodium 142, potassium 3.7. Hemoglobin slowly declined, stabilized in the 10.6 range. Hemoglobin drop was thought to be related to surgical fracture with bleeding around it. Remained relatively hypoxic, levels ranged between 94 to 90 on room air.   At admission he had a CT scan of the head showed some aging changes. No acute process. Shoulder x-ray showed no osseous fracture. Pelvic showed bilateral hip arthroplasties that were still intact, osteopenia, and a nondisplaced fracture of the right inferior pubic rami. Left hip showed no fracture. Right hip no fracture. Portable chest  showed no acute disease. Follow-up chest x-ray because of his hypoxia again showed no acute disease and ultimately had a V/Q scan that was low probability for pulmonary emboli though he had been on anticoagulants.   HOSPITAL COURSE: Vital signs remained from 148/72 to 131/76. His antihypertensive medications the amlodipine 5 mg daily was held. He was maintained on his Aricept 10 mg daily. Maintained on his glaucoma medications. His Protonix was maintained for his gastric ulcer and acid syndrome.   CURRENT MEDICATIONS: At the time of this dictation his current medications include:  1. Tylenol 800 mg every eight hours. 2. Tylenol 500 mg every four hours p.r.n. for mild pain for a maximum of 3000 mg/24 hours.   3. Norco 5/325, 1 to 2 every four hours for pain scale from 5 to 8.  4. Miacalcin one nasal spray one per day.  5. Calcium and vitamin D 2000 units, 500 units calcium twice a day. 6. Aricept 10 mg daily.  7. Milk of magnesia as needed.  8. Multivitamin daily.  9. Prilosec 40 mg twice a day.  10. Tramadol 50 mg for pain over 8 scale.  11. On Lovenox at the moment. 12. Benzoate for mild cough q.4 hours.  DISCHARGE DIAGNOSES:  1. Acute pelvic fracture.  2. Degenerative arthritis.  3. Age progressive dementia.  4. Hypertensive cardiovascular disease.  5. Previous upper gastrointestinal bleeding secondary to gastric ulcer.  6. Anemia secondary to fracture.   CONSULTATION: Patient was seen in consultation in the hospitalization with palliative care.   ____________________________ Dianah Field. Mable Fill, MD dcc:cms D: 02/23/2012 07:09:44 ET T: 02/23/2012 07:33:31 ET JOB#: [id number]  cc: Darious Rehman C. Danie Hannig,  MD, <Dictator> Sher Hellinger C Laurielle Selmon MD ELECTRONICALLY SIGNED 02/26/2012 13:14

## 2015-01-07 NOTE — Discharge Summary (Signed)
PATIENT NAME:  Mitchell Reynolds, HUSTED MR#:  [id number] DATE OF BIRTH:  [birth year]  DATE OF ADMISSION:  02/18/2012 DATE OF DISCHARGE:  02/24/2012   HOSPITAL COURSE: Mitchell Reynolds is an 81 year old male followed at home with hypertensive, progressive age-related dementia, and degenerative arthritis who fell at home and after being evaluated in the Emergency Room was found to have a pubis rami fracture. It was felt that he was not a candidate for surgical intervention and the patient was admitted for further assessment and evaluation.   He was very lethargic to begin with but slowly improved. His mental status waxed and waned with his dementia, depression, and some possible early delirium which cleared. He was seen in consultation with the palliative care team as well. Effort was to improve his rehabilitation and nutrition and see if a natural healing would occur.   His blood pressure ranged from 138/78. He was afebrile. His mental status waxed and waned in terms of participating with physical therapy.   Laboratory studies at admission showed sugar 94, BUN 19, creatinine 1.38, eGFR 48, white count 10,200, hemoglobin 11.1. At this dictation hemoglobin stabilized at 10.6. His eGFR was greater than 60 with normal potassium.   CAT scan of the brain showed no acute changes with aging changes.   X-ray of his shoulder showed no fracture.   X-ray of his hips showed no fracture bilaterally, left and right.  Chest x-ray showed no active disease.   X-ray of the pelvis showed the ramus fracture. He did have some early issues with hypoxia. This improved with better ventilation efforts. Pulse oximetry runs around 90 at best on room air.   DISCHARGE DIAGNOSES:  1. Acute pelvic fracture. 2. Age progressive dementia. 3. Hypertensive cardiovascular disease, stable.  4. Depression, stable.   MEDICATIONS AT THE TIME OF THIS DICTATION:  1. Tylenol 500 mg every eight hours with extra 500 mg for breakthrough  pain up to a maximum of 3000 mg/24 hours.  2. Norco 5/325 one every four hours for severe pain which he has not required recently. 3. Miacalcin nasal spray 1 puff per nostril per day.  4. Calcium 500 mg/Vitamin D 200 units daily 1 tablet twice a day. 5. Aricept 10 mg daily.  6. Milk of Magnesia 30 mL as needed for constipation.  7. Multivitamin.  8. Tramadol 50 mg for moderate pain for pain scale over 8.  9. He is currently on Lovenox and on Protonix 40 mg daily.   PROGNOSIS: Overall prognosis remains guarded. The patient is to allow for a natural death. This is longstanding on his part prior to admission and was continued during the hospitalization and at discharge.   ____________________________ Dianah Field. Mable Fill, MD dcc:drc D: 02/24/2012 07:49:31 ET T: 02/24/2012 08:03:40 ET JOB#: [id number]  cc: Daaiel Starlin C. Mable Fill, MD, <Dictator> Tawni Millers MD ELECTRONICALLY SIGNED 02/26/2012 13:14

## 2015-01-07 NOTE — Consult Note (Signed)
I talked with the patient today and discussed the nature of his injury. His total hip replacements are intact. He does not need a surgical procedure. I have advised him that the pelvic fracture on the average will take about 8 weeks. I would recommend followup pelvis xray in about 3 weeks to asses for satisfactory healing.  Electronic Signatures: Clare GandySmith, Avelynn Sellin E (MD)  (Signed on 06-Jun-13 14:42)  Authored  Last Updated: 06-Jun-13 14:42 by Clare GandySmith, Lore Polka E (MD)

## 2015-01-07 NOTE — Consult Note (Signed)
81 year old male with right hip pain. reviewed showing total hip arthroplasty on the right with considerable osseous reapsorption superior to the acetabulum, which is chronic. Acutely she appears to have a minimally displaced fracture of the inferior pubic ramus. orders are written. I will see the patient later today to discuss the fracture with the patient.  Electronic Signatures: Clare GandySmith, Olayinka Gathers E (MD)  (Signed on 05-Jun-13 12:12)  Authored  Last Updated: 05-Jun-13 12:12 by Clare GandySmith, Avyn Aden E (MD)
